# Patient Record
Sex: Male | Born: 1965 | Race: Black or African American | Hispanic: No | Marital: Married
Health system: Southern US, Community
[De-identification: ages and names within clinical notes are randomized; demographics above are authoritative.]

---

## 2014-02-16 ENCOUNTER — Ambulatory Visit: Payer: Self-pay

## 2014-02-26 ENCOUNTER — Ambulatory Visit: Payer: Self-pay

## 2014-03-02 ENCOUNTER — Ambulatory Visit: Payer: Self-pay

## 2014-03-28 ENCOUNTER — Ambulatory Visit: Payer: Self-pay

## 2016-02-25 ENCOUNTER — Ambulatory Visit: Payer: Medicare HMO | Admitting: Podiatry

## 2016-03-11 ENCOUNTER — Ambulatory Visit (HOSPITAL_BASED_OUTPATIENT_CLINIC_OR_DEPARTMENT_OTHER)
Admission: RE | Admit: 2016-03-11 | Discharge: 2016-03-11 | Disposition: A | Discharge status: Home / Self Care | Payer: Medicare HMO | Source: Ambulatory Visit | Attending: Podiatry | Admitting: Podiatry

## 2016-03-11 ENCOUNTER — Ambulatory Visit (INDEPENDENT_AMBULATORY_CARE_PROVIDER_SITE_OTHER): Payer: Medicare HMO | Admitting: Podiatry

## 2016-03-11 VITALS — BP 136/83 | HR 79 | Resp 16

## 2016-03-11 DIAGNOSIS — M7731 Calcaneal spur, right foot: Secondary | ICD-10-CM | POA: Insufficient documentation

## 2016-03-11 DIAGNOSIS — M25571 Pain in right ankle and joints of right foot: Secondary | ICD-10-CM

## 2016-03-11 DIAGNOSIS — M79676 Pain in unspecified toe(s): Secondary | ICD-10-CM

## 2016-03-11 DIAGNOSIS — R52 Pain, unspecified: Secondary | ICD-10-CM | POA: Diagnosis not present

## 2016-03-11 DIAGNOSIS — B351 Tinea unguium: Secondary | ICD-10-CM | POA: Diagnosis not present

## 2016-03-11 DIAGNOSIS — G8929 Other chronic pain: Secondary | ICD-10-CM | POA: Diagnosis not present

## 2016-03-11 DIAGNOSIS — G629 Polyneuropathy, unspecified: Secondary | ICD-10-CM

## 2016-03-11 NOTE — Progress Notes (Signed)
   Subjective:    Patient ID: Armando ReichertCarlton V Wool, male    DOB: 02-25-1966, 50 y.o.   MRN: [id number]  HPI 50 year old male presents the office today for concerns of nail thickening discoloration. He had an injury back in 2009 injuring his right ankle for which she underwent reconstructive surgery and repair of his ankle. Since being the cast for prolongated time he notices toenails changing since then. He is tried over-the-counter treatment any relief. He also states that his right ankle is been weak and this has been ongoing since the injury. He has been seen sports medicine in place and the brace. He does have numbness to his right foot which is been ongoing since the injury has not been changed. He was given some Lyrica however stopped it due to side effects. He was properly oral medicine at this time if he can avoid it. No recent injury or trauma. He does wear compression socks which help a lot.   Review of Systems  Cardiovascular: Positive for leg swelling.  Musculoskeletal: Positive for joint swelling and gait problem.  All other systems reviewed and are negative.      Objective:   Physical Exam General: AAO x3, NAD  Dermatological: Nails are hypertrophic, dystrophic, brittle, discolored, elongated 10. There is no swelling erythema or drainage. There is tenderness to nails 1-5 bilaterally. No open lesions or pre-ulcerative lesions are identified this time. Scar from prior surgery right ankle well-healed.  Vascular: Dorsalis Pedis artery and Posterior Tibial artery pedal pulses are 2/4 bilateral with immedate capillary fill time. Pedal hair growth present. Mild chronic edema to the right ankle. There is no pain with calf compression, swelling, warmth, erythema.   Neruologic: Sensation decreased on the right foot with Simms Weinstein monofilament and vibratory sensation. Intact on the left.  Musculoskeletal: Mild tenderness diffusely on the right ankle however this has been unchanged per  the patient. MMT 4/5 on the right, 5/5 on the left.   Gait: Unassisted, Nonantalgic.      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 Hardware intact with a right angle. No evidence of acute fracture. -For the right ankle recommended to continue with the brace which she is having made currently with sports medicine. Also continue compression socks. I've also ordered a compound cream to help with the neuropathy type symptoms. -The nails were debrided today and they're sent for culture. Discussed treatment options for onychomycosis however we will await the results of the biopsy before proceeding. He will likely undergo Lamisil. -Follow-up after nail culture or sooner if any problems arise. In the meantime, encouraged to call the office with any questions, concerns, change in symptoms.   Ovid CurdMatthew Sylver Vantassell, DPM

## 2016-03-27 ENCOUNTER — Telehealth: Payer: Self-pay | Admitting: Podiatry

## 2016-03-27 NOTE — Telephone Encounter (Signed)
Left voicemail for pt to call to schedule an appt to discuss [facility] results.

## 2016-03-30 ENCOUNTER — Telehealth: Payer: Self-pay | Admitting: Podiatry

## 2016-03-30 NOTE — Telephone Encounter (Signed)
lvm for pt to call to schedule a follow up appt per Dr Ardelle AntonWagoner to discuss bako path results

## 2016-04-10 ENCOUNTER — Telehealth: Payer: Self-pay | Admitting: *Deleted

## 2016-04-10 NOTE — Telephone Encounter (Addendum)
Dr. Ardelle AntonWagoner reviewed 03/11/2016 fungal culture results as +, ordered pt to make an appt.  Left message for pt to make an appt to discuss results and treatment. 05/26/2016-Pt states his pharmacy says the medication is not there. I reviewed medication orders and they were confirmed by [facility] on 04/15/2016 at 5:02pm. I confirmed with pt his pharmacy and told him some cases if the medication is not picked up within a certain time frame it will be placed back in stock and the pharmacy will say the order is not there. I reordered and told pt to go immediately so he wouldn't miss out again.

## 2016-04-15 ENCOUNTER — Ambulatory Visit (INDEPENDENT_AMBULATORY_CARE_PROVIDER_SITE_OTHER): Payer: Medicare HMO | Admitting: Podiatry

## 2016-04-15 DIAGNOSIS — B351 Tinea unguium: Secondary | ICD-10-CM | POA: Diagnosis not present

## 2016-04-15 DIAGNOSIS — M79676 Pain in unspecified toe(s): Secondary | ICD-10-CM

## 2016-04-15 MED ORDER — TERBINAFINE HCL 250 MG PO TABS: 250.0000 mg | ORAL_TABLET | Freq: Every day | ORAL | Status: AC

## 2016-04-15 NOTE — Progress Notes (Signed)
Patient ID: Calvin Ritter, male   DOB: 04-10-1966, 50 y.o.   MRN: [id number]  Subjective: 50 year old male persists the [place] discussed nail culture results. He states he is nails are elongated and painful lip trimmed as well. No surrounding redness or drainage. The nails are painful and he cannot trim himself. He has not yet received the compound cream for the neuropathy due to cost. Denies any systemic complaints such as fevers, chills, nausea, vomiting. No acute changes since last appointment, and no other complaints at this time.   Objective: AAO x3, NAD DP/PT pulses palpable bilaterally, CRT less than 3 seconds Nails are hypertrophic, dystrophic, brittle, discolored, elongated 10. There is no swelling erythema or drainage. Dictated bilateral hallux toenails appear to be most affected. No areas of pinpoint bony tenderness or pain with vibratory sensation.  No edema, erythema, increase in warmth to bilateral lower extremities.  No open lesions or pre-ulcerative lesions.  No pain with calf compression, swelling, warmth, erythema  Assessment: Symptomatic onychomycosis  Plan: -All treatment options discussed with the patient including all alternatives, risks, complications.  Nails debrided -10 without complications or bleeding. -Discussed nail culture results. After long discussion the patient regards options he elected to proceed with Lamisil. Discussed side effects of the medication. No alcohol use with the medicine. Protective L obtain CBC provided as well as 3 days the medicine. Follow-up in 4 weeks or sooner if needed. -Patient encouraged to call the office with any questions, concerns, change in symptoms.   Ovid CurdMatthew Wagoner, DPM

## 2016-04-15 NOTE — Patient Instructions (Signed)

## 2016-04-16 LAB — HEPATIC FUNCTION PANEL
ALBUMIN: 3.9 g/dL (ref 3.6–5.1)
ALK PHOS: 74 U/L (ref 40–115)
ALT: 16 U/L (ref 9–46)
AST: 14 U/L (ref 10–40)
BILIRUBIN DIRECT: 0.1 mg/dL (ref <=0.2)
BILIRUBIN INDIRECT: 0.2 mg/dL (ref 0.2–1.2)
BILIRUBIN TOTAL: 0.3 mg/dL (ref 0.2–1.2)
Total Protein: 6.5 g/dL (ref 6.1–8.1)

## 2016-04-21 ENCOUNTER — Encounter: Payer: Self-pay | Admitting: Podiatry

## 2016-05-13 ENCOUNTER — Telehealth: Payer: Self-pay

## 2016-05-13 NOTE — Telephone Encounter (Signed)
Left VM informing pt that his hepatic function panel was WNL, and he can start lamisil

## 2016-05-20 ENCOUNTER — Telehealth: Payer: Self-pay | Admitting: *Deleted

## 2016-05-20 NOTE — Telephone Encounter (Addendum)
Pt called for lab results. 05/21/2016-Left message informing pt his labs were within normal limits and he could begin the medication.  I informed pt that he had a message left last week concerning the labs.

## 2016-05-20 NOTE — Telephone Encounter (Signed)
I think Shanda Bumpsjessica took care of this last week

## 2016-05-26 MED ORDER — TERBINAFINE HCL 250 MG PO TABS: 250.0000 mg | ORAL_TABLET | Freq: Every day | ORAL | Status: AC

## 2017-01-11 DIAGNOSIS — R52 Pain, unspecified: Secondary | ICD-10-CM

## 2017-01-19 ENCOUNTER — Ambulatory Visit (HOSPITAL_BASED_OUTPATIENT_CLINIC_OR_DEPARTMENT_OTHER)
Admission: RE | Admit: 2017-01-19 | Discharge: 2017-01-19 | Disposition: A | Discharge status: Home / Self Care | Payer: Medicare HMO | Source: Ambulatory Visit | Attending: Podiatry | Admitting: Podiatry

## 2017-01-19 ENCOUNTER — Ambulatory Visit (INDEPENDENT_AMBULATORY_CARE_PROVIDER_SITE_OTHER): Payer: Medicare HMO | Admitting: Podiatry

## 2017-01-19 DIAGNOSIS — Z8781 Personal history of (healed) traumatic fracture: Secondary | ICD-10-CM | POA: Diagnosis not present

## 2017-01-19 DIAGNOSIS — M84361A Stress fracture, right tibia, initial encounter for fracture: Secondary | ICD-10-CM | POA: Diagnosis not present

## 2017-01-19 DIAGNOSIS — M19071 Primary osteoarthritis, right ankle and foot: Secondary | ICD-10-CM

## 2017-01-19 DIAGNOSIS — Z9889 Other specified postprocedural states: Secondary | ICD-10-CM | POA: Diagnosis not present

## 2017-01-19 MED ORDER — HYDROCODONE-ACETAMINOPHEN 10-325 MG PO TABS: 1.0000 | ORAL_TABLET | Freq: Four times a day (QID) | ORAL | 0 refills | Status: AC | PRN

## 2017-01-19 MED ORDER — HYDROCODONE-ACETAMINOPHEN 10-325 MG PO TABS: 1.0000 | ORAL_TABLET | Freq: Four times a day (QID) | ORAL | 0 refills | Status: DC | PRN

## 2017-01-19 MED ORDER — IBUPROFEN 800 MG PO TABS: 800.0000 mg | ORAL_TABLET | Freq: Three times a day (TID) | ORAL | 0 refills | Status: AC | PRN

## 2017-01-20 ENCOUNTER — Telehealth: Payer: Self-pay | Admitting: *Deleted

## 2017-01-20 DIAGNOSIS — M84361A Stress fracture, right tibia, initial encounter for fracture: Secondary | ICD-10-CM

## 2017-01-20 DIAGNOSIS — M19071 Primary osteoarthritis, right ankle and foot: Secondary | ICD-10-CM

## 2017-01-20 NOTE — Telephone Encounter (Signed)
Calvin SquibbJane states radiologist wanted her to call report for right ankle, "Questionable fracture likely arising from calcaneous, area is difficult to locate and finding not seen on other views, potentially may warrant other foot imaging or CT to assess. I reviewed Imaging and the results are in Chart Review for review by Dr. Ardelle AntonWagoner, I sent note to inform their availability.

## 2017-01-20 NOTE — Telephone Encounter (Signed)
Please order a CT scan of the right foot and ankle. Thanks

## 2017-01-21 NOTE — Progress Notes (Signed)
Subjective: 51 year old male presents to the office today for follow-up of toenail fungus. He states he finished the lamisil and that his toenail are looking a lot better. Denies any pain to the toenails.   He also secondary complaints of his right ankle and leg hurting and he feels that he may have a stress fracture. He has had several before and he feels that he may be developing another. He gets pain to his leg, but not his foot. No increase in swelling but he has pain after walking. H continues to get pain to the ankle and he notices it crakes every time he walks, especially if he does not wear a brace. He has been on diability as she has not been able to work given the ankle pain and nerve damage.   Denies any systemic complaints such as fevers, chills, nausea, vomiting. No acute changes since last appointment, and no other complaints at this time.   Objective: AAO x3, NAD DP/PT pulses palpable bilaterally, CRT less than 3 seconds Nails are much improved. There is minimal discoloration to the nails but much improved. No pain, swelling, redness, drainage. He has mild pain to the distal portion of the leg proximal to the ankle. No pain with vibratory sensation. No significant swelling. Mild pain along the medial malleolus and hardware is palpable. No pain to the foot. There is crepitation with ankle joint ROM.   No open lesions or pre-ulcerative lesions.  No pain with calf compression, swelling, warmth, erythema  Assessment: Resolving onychomycosis; rule out fracture  Plan: -All treatment options discussed with the patient including all alternatives, risks, complications.  -Nails are much improved. Will continue to monitor for any reocurrance -X-rays ordered of the left foot, ankle, leg. Has a brace and reocmmeded to continue with this. I had a long discussion with him in regards to treatment options for his ankle but we will hold off for now. Continue conservative treatment. Continue to  recommend permanent disability.  -Follow-up in 2 weeks or sooner if needed.  -Patient encouraged to call the office with any questions, concerns, change in symptoms.   Ovid CurdMatthew Vondell Babers, DPM

## 2017-01-21 NOTE — Telephone Encounter (Addendum)
-----   Message from Vivi BarrackMatthew R Wagoner, DPM sent at 01/20/2017 11:54 AM EDT ----- Please order CT scan with contrast if able to look for fracture/infection. 01/21/2017-Gave orders to [facility] for [facility].01/25/2017-Pt states he has not received the letter from Dr. Ardelle AntonWagoner for disability.02/02/2017-Pt states he has not received the letter from Dr.Wagoner concerning his disability.02/09/2017-Pt left name and phone number. I spoke with pt and he states he still has not received the disability letter for our office. I transferred to S. [place], Careers information officerClerical supervisor to leave a message.

## 2017-01-26 ENCOUNTER — Telehealth: Payer: Self-pay | Admitting: *Deleted

## 2017-01-26 NOTE — Telephone Encounter (Signed)
"  He is scheduled for CT scans tomorrow.  Calling to see if you have it authorized.  He has [employer]."  I will work on it.

## 2017-01-26 NOTE — Telephone Encounter (Signed)
I called and left Calvin Ritter in pre-service a message that CT with contrast of foot/ankle was authorized.  The authorization number is [id number] and is valid from 01/27/2017 to 02/26/2017.

## 2017-01-27 ENCOUNTER — Ambulatory Visit (HOSPITAL_BASED_OUTPATIENT_CLINIC_OR_DEPARTMENT_OTHER): Payer: Medicare HMO

## 2017-01-27 ENCOUNTER — Telehealth: Payer: Self-pay | Admitting: *Deleted

## 2017-01-27 ENCOUNTER — Encounter (HOSPITAL_BASED_OUTPATIENT_CLINIC_OR_DEPARTMENT_OTHER): Payer: Self-pay

## 2017-01-27 NOTE — Telephone Encounter (Signed)
"  Only one procedure was approved.  I called [employer] and [facility] and she said only one unit was covered.  The authorization is needed for the foot and for the ankle."  I called [employer] and was transferred to [employer].  I [facility] to [facility].  We started a new request.  I answered all questions and received  Verbal approval.  She said [employer] would send an approval letter.  She gave me a tracking number which is [id number].  I left a message for Pam at [facility] that other procedure was authorized.

## 2017-01-29 ENCOUNTER — Encounter: Payer: Self-pay | Admitting: Podiatry

## 2017-02-02 ENCOUNTER — Ambulatory Visit (HOSPITAL_BASED_OUTPATIENT_CLINIC_OR_DEPARTMENT_OTHER): Admission: RE | Admit: 2017-02-02 | Payer: Medicare HMO | Source: Ambulatory Visit

## 2017-02-02 ENCOUNTER — Telehealth: Payer: Self-pay | Admitting: *Deleted

## 2017-02-02 NOTE — Telephone Encounter (Signed)
Radiology from [facility] called me and stated the patient has not came in for the CT scan and rescheduled the first time and did not show up today. Calvin Ritter

## 2017-06-10 IMAGING — DX DG TIBIA/FIBULA 2V*R*
2 series · 2 of 2 positions shown · non-contrast
Comparison: Right ankle images obtained earlier in the day

CLINICAL DATA: Pain

EXAM:
RIGHT TIBIA AND FIBULA - 2 VIEW

[tibia ap]
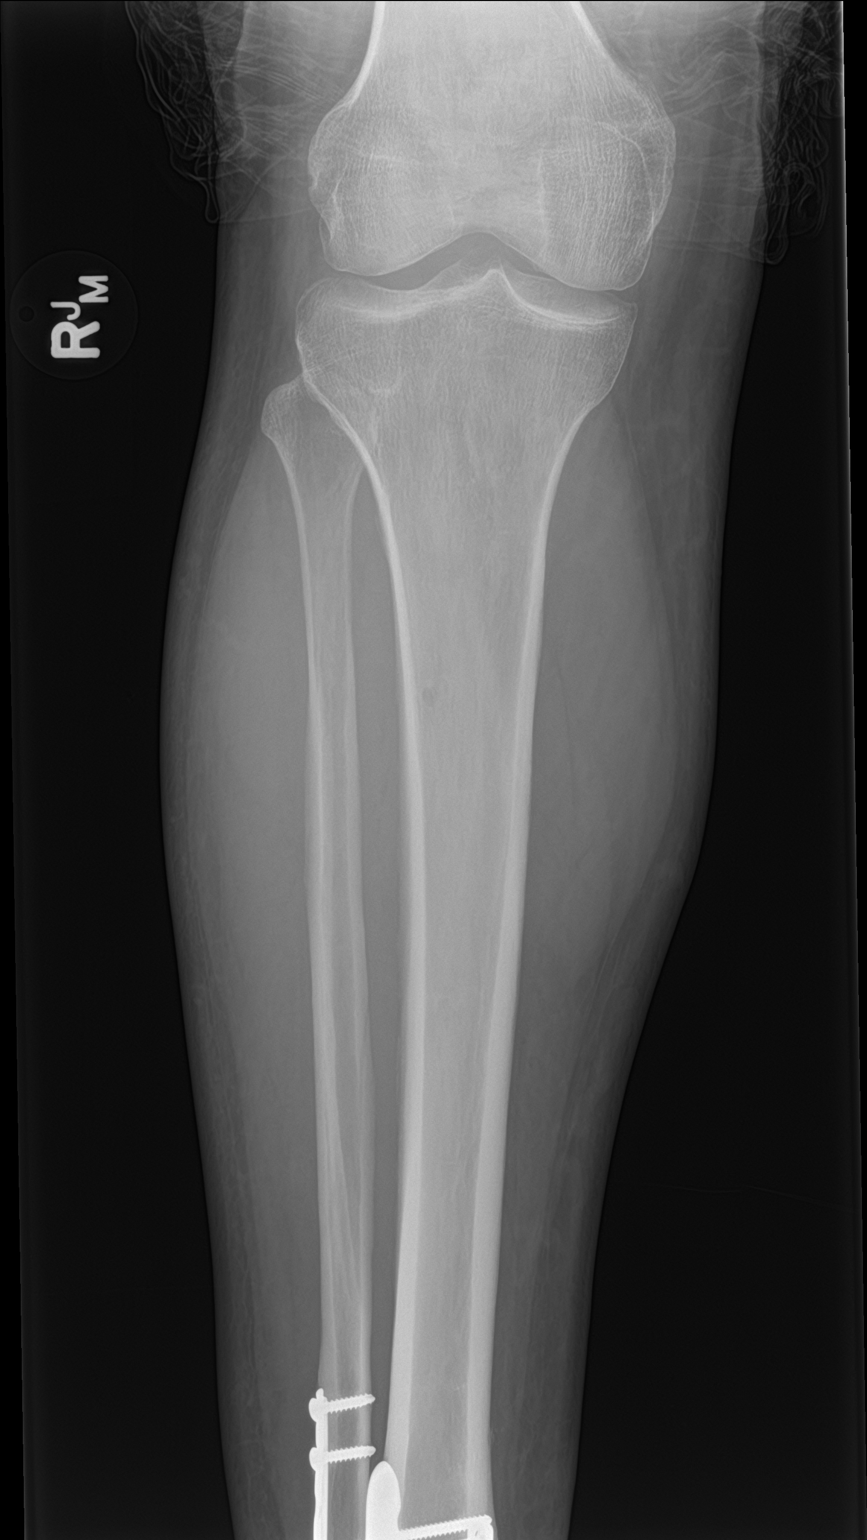

[tibia lat]
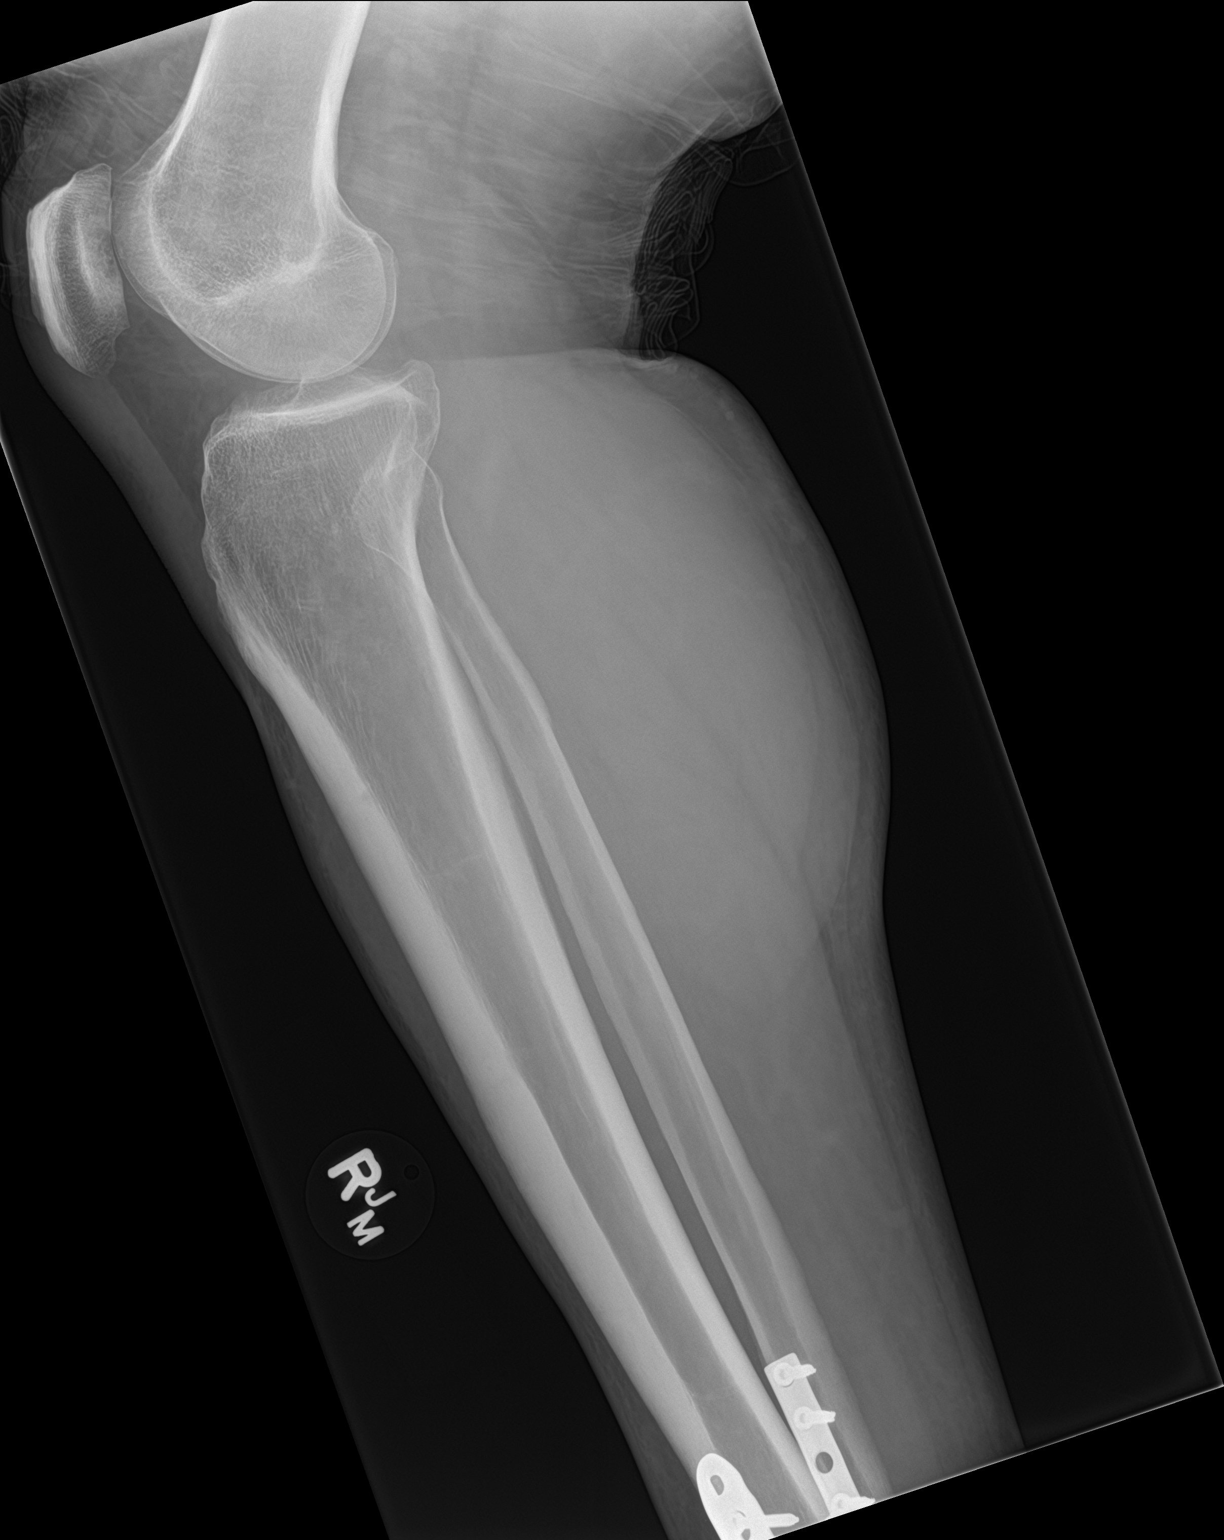

[2 of 2 positions shown; findings below may reference images not displayed]

FINDINGS: Frontal and lateral views obtained. Extensive postoperative change
noted distally. Somewhat inhomogeneous appearance in the distal
tibia. No acute fracture or dislocation involving the tibia or
fibula. No knee joint effusion.
IMPRESSION: Postoperative change distally. No acute fracture or dislocation. It
should be noted that there is inhomogeneous appearance in the distal
tibia. This finding most likely is due to chronic remodeling from
old trauma. A degree of chronic osteomyelitis in this area cannot be
entirely excluded radiographically, however. If there is concern for
osteomyelitis in this area, nuclear medicine indium scan may be
helpful to further assess.

## 2017-06-10 IMAGING — DX DG FOOT 2V*R*
2 series · 2 of 2 positions shown · non-contrast
Comparison: Right foot films of 03/11/2016

CLINICAL DATA: Pain in the midshaft of the tibia and fibula as well
as the media malleolus, right ankle surgery in 2878

EXAM:
RIGHT FOOT - 2 VIEW

[foot ap]
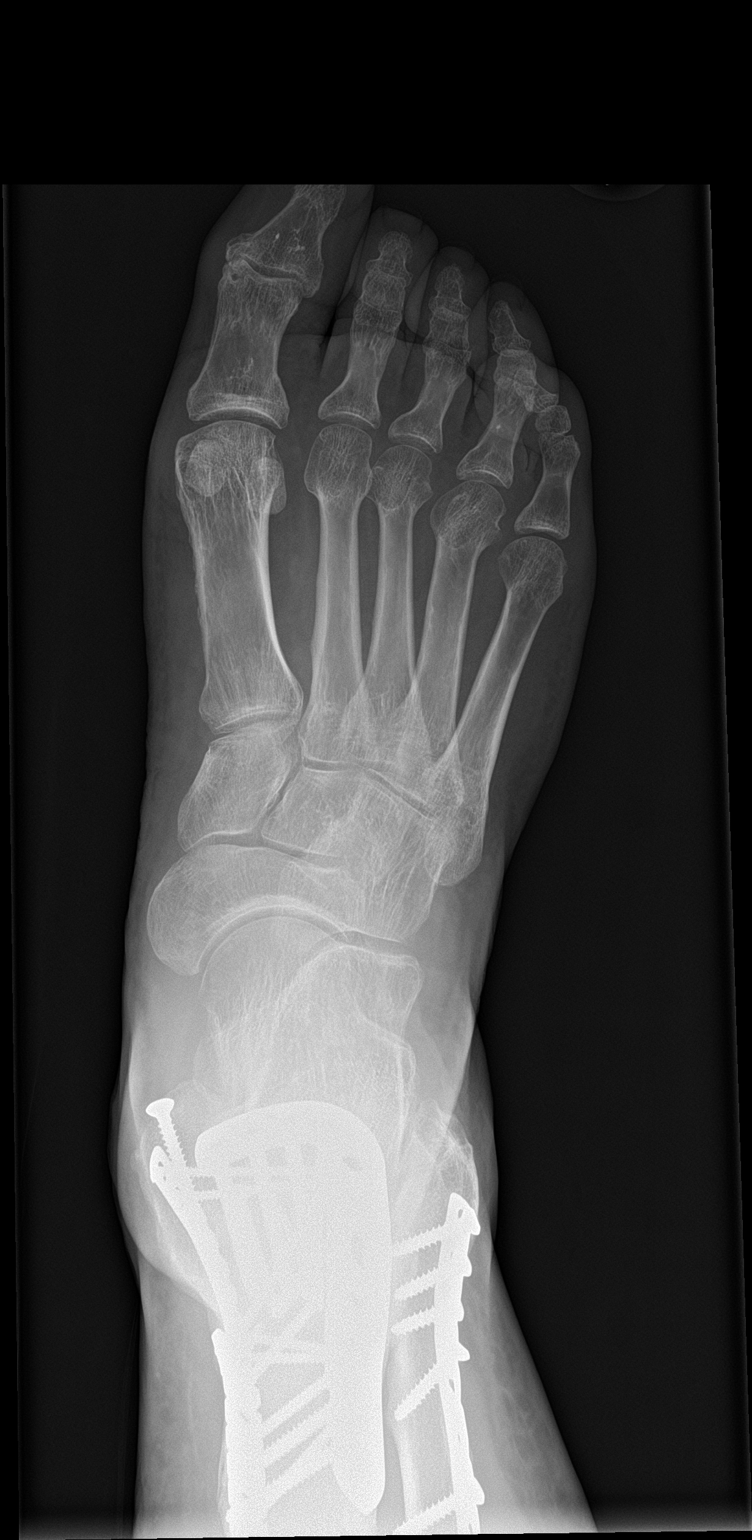

[foot lat]
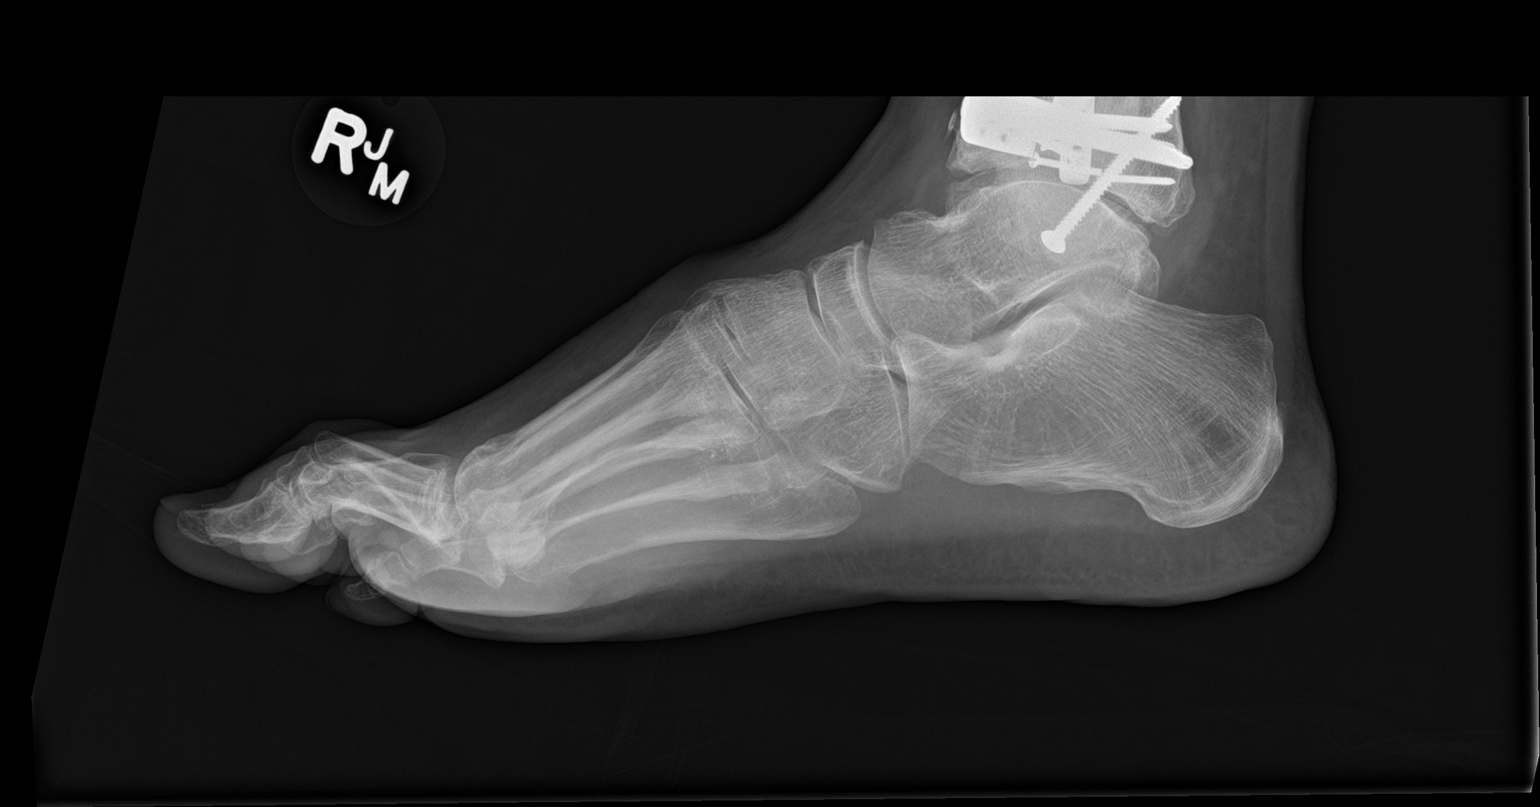

[2 of 2 positions shown; findings below may reference images not displayed]

FINDINGS: Tarsal-metatarsal alignment is normal. No fracture is seen. Joint
spaces appear normal. No erosion is noted. Hardware for fixation of
previous distal right tibial and fibular fractures remains.
IMPRESSION: No acute abnormality.

## 2017-11-22 DIAGNOSIS — M79676 Pain in unspecified toe(s): Secondary | ICD-10-CM

## 2019-01-05 ENCOUNTER — Ambulatory Visit: Payer: Medicare Other | Admitting: Podiatry

## 2019-01-12 ENCOUNTER — Ambulatory Visit: Payer: Medicare Other | Admitting: Podiatry

## 2019-01-31 ENCOUNTER — Ambulatory Visit: Payer: Medicare Other | Admitting: Podiatry
